# Patient Record
Sex: Female | Born: 1965 | Race: White | Hispanic: No | Marital: Single | Smoking: Former smoker
Health system: Southern US, Community
[De-identification: ages and names within clinical notes are randomized; demographics above are authoritative.]

## PROBLEM LIST (undated history)

## (undated) DIAGNOSIS — N809 Endometriosis, unspecified: Secondary | ICD-10-CM

## (undated) DIAGNOSIS — I10 Essential (primary) hypertension: Secondary | ICD-10-CM

## (undated) DIAGNOSIS — N3010 Interstitial cystitis (chronic) without hematuria: Secondary | ICD-10-CM

## (undated) DIAGNOSIS — Z9889 Other specified postprocedural states: Secondary | ICD-10-CM

## (undated) DIAGNOSIS — R112 Nausea with vomiting, unspecified: Secondary | ICD-10-CM

## (undated) HISTORY — DX: Endometriosis, unspecified: N80.9

## (undated) HISTORY — DX: Interstitial cystitis (chronic) without hematuria: N30.10

---

## 1996-09-28 HISTORY — PX: LAPAROSCOPY: SHX197

## 2005-12-02 ENCOUNTER — Ambulatory Visit: Payer: Self-pay | Admitting: Unknown Physician Specialty

## 2006-07-21 ENCOUNTER — Ambulatory Visit: Payer: Self-pay | Admitting: Psychiatry

## 2007-11-09 ENCOUNTER — Ambulatory Visit: Payer: Self-pay | Admitting: Unknown Physician Specialty

## 2009-02-20 ENCOUNTER — Ambulatory Visit: Payer: Self-pay | Admitting: Unknown Physician Specialty

## 2010-03-26 ENCOUNTER — Other Ambulatory Visit: Payer: Self-pay | Admitting: Psychiatry

## 2010-04-02 ENCOUNTER — Ambulatory Visit: Payer: Self-pay

## 2010-04-28 ENCOUNTER — Encounter: Payer: Self-pay | Admitting: Internal Medicine

## 2010-05-18 ENCOUNTER — Emergency Department: Payer: Self-pay | Admitting: Emergency Medicine

## 2010-05-20 ENCOUNTER — Ambulatory Visit: Payer: Self-pay | Admitting: Internal Medicine

## 2010-05-27 ENCOUNTER — Ambulatory Visit: Payer: Self-pay | Admitting: Pain Medicine

## 2010-05-29 ENCOUNTER — Encounter: Payer: Self-pay | Admitting: Internal Medicine

## 2010-06-12 ENCOUNTER — Ambulatory Visit: Payer: Self-pay | Admitting: Pain Medicine

## 2010-06-26 ENCOUNTER — Ambulatory Visit: Payer: Self-pay | Admitting: Pain Medicine

## 2010-08-19 ENCOUNTER — Ambulatory Visit (HOSPITAL_COMMUNITY)
Admission: RE | Admit: 2010-08-19 | Discharge: 2010-08-19 | Payer: Self-pay | Source: Home / Self Care | Admitting: Psychiatry

## 2010-09-17 ENCOUNTER — Ambulatory Visit: Payer: Self-pay | Admitting: Internal Medicine

## 2012-04-19 ENCOUNTER — Encounter: Payer: Self-pay | Admitting: Orthopedic Surgery

## 2012-04-28 ENCOUNTER — Encounter: Payer: Self-pay | Admitting: Orthopedic Surgery

## 2012-05-29 ENCOUNTER — Encounter: Payer: Self-pay | Admitting: Orthopedic Surgery

## 2012-06-28 ENCOUNTER — Encounter: Payer: Self-pay | Admitting: Orthopedic Surgery

## 2012-07-13 ENCOUNTER — Ambulatory Visit: Payer: Self-pay | Admitting: Internal Medicine

## 2012-09-28 HISTORY — PX: ORIF TIBIA & FIBULA FRACTURES: SHX2131

## 2014-09-28 HISTORY — PX: BACK SURGERY: SHX140

## 2015-08-01 ENCOUNTER — Other Ambulatory Visit: Payer: Self-pay | Admitting: Internal Medicine

## 2015-08-01 DIAGNOSIS — M5416 Radiculopathy, lumbar region: Secondary | ICD-10-CM

## 2015-08-07 ENCOUNTER — Ambulatory Visit
Admission: RE | Admit: 2015-08-07 | Discharge: 2015-08-07 | Disposition: A | Discharge status: Home / Self Care | Payer: PRIVATE HEALTH INSURANCE | Source: Ambulatory Visit | Attending: Internal Medicine | Admitting: Internal Medicine

## 2015-08-07 DIAGNOSIS — M47897 Other spondylosis, lumbosacral region: Secondary | ICD-10-CM | POA: Diagnosis not present

## 2015-08-07 DIAGNOSIS — M5126 Other intervertebral disc displacement, lumbar region: Secondary | ICD-10-CM | POA: Diagnosis not present

## 2015-08-07 DIAGNOSIS — G544 Lumbosacral root disorders, not elsewhere classified: Secondary | ICD-10-CM | POA: Insufficient documentation

## 2015-08-07 DIAGNOSIS — M5416 Radiculopathy, lumbar region: Secondary | ICD-10-CM | POA: Diagnosis present

## 2015-08-07 DIAGNOSIS — M4806 Spinal stenosis, lumbar region: Secondary | ICD-10-CM | POA: Diagnosis not present

## 2015-08-07 DIAGNOSIS — M21372 Foot drop, left foot: Secondary | ICD-10-CM | POA: Diagnosis present

## 2015-08-13 ENCOUNTER — Other Ambulatory Visit: Payer: Self-pay | Admitting: Internal Medicine

## 2015-08-13 DIAGNOSIS — Z1231 Encounter for screening mammogram for malignant neoplasm of breast: Secondary | ICD-10-CM

## 2015-08-14 ENCOUNTER — Ambulatory Visit
Admission: RE | Admit: 2015-08-14 | Discharge: 2015-08-14 | Disposition: A | Discharge status: Home / Self Care | Payer: PRIVATE HEALTH INSURANCE | Source: Ambulatory Visit | Attending: Internal Medicine | Admitting: Internal Medicine

## 2015-08-14 DIAGNOSIS — Z1231 Encounter for screening mammogram for malignant neoplasm of breast: Secondary | ICD-10-CM

## 2015-08-15 ENCOUNTER — Ambulatory Visit: Payer: PRIVATE HEALTH INSURANCE

## 2015-10-01 ENCOUNTER — Ambulatory Visit: Payer: PRIVATE HEALTH INSURANCE

## 2015-10-03 ENCOUNTER — Ambulatory Visit: Payer: PRIVATE HEALTH INSURANCE | Attending: Neurosurgery

## 2015-10-03 DIAGNOSIS — M21372 Foot drop, left foot: Secondary | ICD-10-CM | POA: Diagnosis present

## 2015-10-03 DIAGNOSIS — M5442 Lumbago with sciatica, left side: Secondary | ICD-10-CM | POA: Diagnosis present

## 2015-10-03 NOTE — Patient Instructions (Signed)
Diaphragmatic breathing (initiating TA contraction)  Diaphragmatic breathing (initiating TA contraction) with bridging Pt education on neutral spine and pelvis for sitting and standing

## 2015-10-03 NOTE — Therapy (Signed)
[place] [facility] [address] [place], [place], [address] Phone: [phone]   Fax:  [phone]  Physical Therapy Treatment  Patient Details  Name: Susan Maxwell MRN: [id number] Date of Birth: 09-04-1966 Referring Provider: Peggye LeyJeffery Jenkins  Encounter Date: 10/03/2015      PT End of Session - 10/03/15 1241    Visit Number 1   Number of Visits 13   Date for PT Re-Evaluation 11/14/15   PT Start Time 0902   PT Stop Time 0955   PT Time Calculation (min) 53 min   Activity Tolerance Patient tolerated treatment well   Behavior During Therapy [facility] for tasks assessed/performed      No past medical history on file.  No past surgical history on file.  There were no vitals filed for this visit.  Visit Diagnosis:  Midline low back pain with left-sided sciatica - Plan: PT plan of care cert/re-cert  Left foot drop - Plan: PT plan of care cert/re-cert      Subjective Assessment - 10/03/15 0908    Subjective pt reports in mid october she had a "different kind of back pain" she felt from back, radiating into the L buttocks/groin. she reports a week later she noticed she was walking and "hearing her foot slap". She followed up with MD and had an MRI which showed a disc protruding onto the L L4 nerve root. she underwent discectomy 08/25/16. She reports the surgery went well and reports she feels her leg  and ankle feels 30% better.    Patient Stated Goals pt would like to have more strength in the L leg   Currently in Pain? No/denies  she has intermittant pain 5/10 in the lower back            [facility] PT Assessment - 10/03/15 0001    Assessment   Medical Diagnosis s/p discectomy   Referring Provider Peggye LeyJeffery Jenkins   Onset Date/Surgical Date 08/26/15   Prior Therapy no   Precautions   Precautions Back  no bending lifting or twisting   Balance Screen   Has the patient fallen in the past 6 months No   Has the patient had a  decrease in activity level because of a fear of falling?  No   Is the patient reluctant to leave their home because of a fear of falling?  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   Vocation Full time employment   Vocation Requirements bending, lifting, moving patients   Leisure walking, would like to be able to run   Cognition   Overall Cognitive Status Within Functional Limits for tasks assessed         POSTURE/OBSERVATION: Reduced lordosis in sitting   PROM/AROM: did not measure lumbar ROM due to restrictions  STRENGTH:  Graded on a 0-5 scale Muscle Group Left Right                          Hip Flex 3+ 4  Hip Abd 4- 4  Hip Add 4- 4   Hip Ext 3+ 3+  Hip IR/ER    Knee Flex 4- 4+  Knee Ext 4 4  Ankle DF 4- 5  Ankle PF 4 4+   Dermatomes impaired L L4 Myotomes impaired L L4, also noted weakness noted above     SPECIAL TESTS: SLR- reduced hamstring flexibility on the L with SLR to 50deg, R side 70 deg  FUNCTIONAL MOBILITY: Independent with all bed mobility and transfers without AD. Pt educated on log roll  BALANCE: Impaired SLS om LLe  GAIT: L foot slap, increases with fatigue  OUTCOME MEASURES: TEST Outcome Interpretation  5 times sit<>stand sec >60 yo, >15 sec indicates increased risk for falls  10 meter walk test                 m/s <1.0 m/s indicates increased risk for falls; limited community ambulator  Timed up and Go                 sec <14 sec indicates increased risk for falls  6 minute walk test     1475           Feet  1000 feet is community Financial controller  <36/56 (100% risk for falls), 37-45 (80% risk for falls); 46-51 (>50% risk for falls); 52-55 (lower risk <25% of falls)                          PT Education - 10/03/15 1241    Education provided Yes   Education Details HEP, POC   Person(s) Educated Patient   Methods Explanation   Comprehension Verbalized understanding;Returned demonstration             PT Long Term Goals - 10/03/15 1250    PT LONG TERM GOAL #1   Title pt will be able to confidently run x 52m to return to PLOF.    Time 6   Period Weeks   Status New   PT LONG TERM GOAL #2   Title pt will demonstrate proper lifting mechanics of 25lbs from floor to table x 5 with no lower back pain.    Time 6   Period Weeks   Status New   PT LONG TERM GOAL #3   Title pt will improve walk distance to 1860ft for age norm without foot slap.    Time 6   Period Weeks   Status New   PT LONG TERM GOAL #4   Title pt will improve core and/or rectus abdominis strength to at least 3/5 for improved core stability    Baseline 2/5   Time 6   Period Weeks   Status New               Plan - 10/03/15 1242    Clinical Impression Statement pt presents with improving LE symptoms s/p discectomy performed 08/26/15. she currently is on lumbar restrictions of no bending, lifting or twisting. she demonstrates bilateral LE weakness L>R with + foot slap on the L. she does still have some reduced sensation along L4 dermatome, but is improving. she also demonstrates reduced core strength which could lead to further lower back pain and re-injury, especially given her occupation. pt would benefit from skilled PT services to address the above stated impairments to return to PLOF, reduce pain, prevent new injury and maximize function.    Pt will benefit from  skilled therapeutic intervention in order to improve on the following deficits Abnormal gait;Decreased activity  tolerance;Decreased balance;Hypomobility;Decreased strength;Increased muscle spasms;Pain;Postural dysfunction   Rehab Potential Good   PT Frequency 2x / week   PT Duration 6 weeks   PT Treatment/Interventions ADLs/Self Care Home Management;Aquatic Therapy;Electrical Stimulation;Cryotherapy;Moist Heat;Patient/family education;Neuromuscular re-education;Balance training;Therapeutic exercise;Therapeutic activities;Functional mobility training;Stair training;Gait training;Manual techniques;Dry needling   PT Next Visit Plan progress HEP        Problem List There are no active problems to display for this patient.  Susan Maxwell. Susan Maxwell, PT, DPT [phone]  Susan Maxwell 10/03/2015, 12:56 PM   [facility] MAIN [facility] SERVICES [address] [place], [place], [address] Phone: [phone]   Fax:  [phone]  Name: Susan Maxwell MRN: [id number] Date of Birth: 12-07-1965

## 2015-10-09 ENCOUNTER — Ambulatory Visit: Payer: PRIVATE HEALTH INSURANCE

## 2015-10-09 DIAGNOSIS — M21372 Foot drop, left foot: Secondary | ICD-10-CM

## 2015-10-09 DIAGNOSIS — M5442 Lumbago with sciatica, left side: Secondary | ICD-10-CM | POA: Diagnosis not present

## 2015-10-10 NOTE — Therapy (Signed)
[facility] MAIN [facility] SERVICES [address] [place], [place], [address] Phone: [phone]   Fax:  [phone]  Physical Therapy Treatment  Patient Details  Name: Susan Maxwell MRN: [id number] Date of Birth: [birth year] Referring Provider: Peggye Ley  Encounter Date: 10/09/2015      PT End of Session - 10/10/15 0836    Visit Number 2   Number of Visits 13   Date for PT Re-Evaluation 11/14/15   PT Start Time 1700   PT Stop Time 1740   PT Time Calculation (min) 40 min   Activity Tolerance Patient tolerated treatment well   Behavior During Therapy [facility] for tasks assessed/performed      No past medical history on file.  No past surgical history on file.  There were no vitals filed for this visit.  Visit Diagnosis:  Left foot drop      Subjective Assessment - 10/10/15 0835    Subjective pt reports she noticed her L foot is not slapping as bad during gait. pt reports sitting in a chair and twisting with her foot planted injuring her knee. she would like PT to screen it.    Patient Stated Goals pt would like to have more strength in the L leg       R knee screen provided per pt request no charge: (-) valgus/varus stress test (-) anterior drawer/lachmans test  (+) pain with palpation to medial and lateral joint like (+) McMurry';s test for pain , no popping (+) Thessalys test for pain  NMR: NMES provided to L anterior tibialis in sitting 10s on / 20s off time x 15 min total at 10mA. Pt cued to dorsiflex during on time  Therex: Diaphragmatic breathing (initiating TA contraction) with bridging 2x10 Diaphragmatic breathing (initiating TA contraction) with alt LE march x 10 Diaphragmatic breathing (initiating TA contraction) with sidelying hip abduction x 10  Diaphragmatic breathing (initiating TA contraction) with sidelying hip adduction x 10 Pt requires min verbal and tactile cues for proper exercise performance  Especially  focusing on exhale during lifting                          PT Education - 10/10/15 0836    Education provided Yes   Education Details knee screen findings. purpose of NMES, HEP review   Person(s) Educated Patient   Methods Explanation   Comprehension Verbalized understanding             PT Long Term Goals - 10/03/15 1250    PT LONG TERM GOAL #1   Title pt will be able to confidently run x 95m to return to PLOF.    Time 6   Period Weeks   Status New   PT LONG TERM GOAL #2   Title pt will demonstrate proper lifting mechanics of 25lbs from floor to table x 5 with no lower back pain.    Time 6   Period Weeks   Status New   PT LONG TERM GOAL #3   Title pt will improve walk distance to 1869ft for age norm without foot slap.    Time 6   Period Weeks   Status New   PT LONG TERM GOAL #4   Title pt will improve core and/or rectus abdominis strength to at least 3/5 for improved core stability    Baseline 2/5   Time 6   Period Weeks   Status New  Plan - 10/10/15 0837    Clinical Impression Statement pt tolerated NMES well to the L anterior tibialis. R knee screen provided by PT at no charge. no ligamentus instability was found. she does have a + Thessaly's test. PT reinstructed HEP with focus on proper diaphragamatic breathing.    Pt will benefit from skilled therapeutic intervention in order to improve on the following deficits Abnormal gait;Decreased activity tolerance;Decreased balance;Hypomobility;Decreased strength;Increased muscle spasms;Pain;Postural dysfunction   Rehab Potential Good   PT Frequency 2x / week   PT Duration 6 weeks   PT Treatment/Interventions ADLs/Self Care Home Management;Aquatic Therapy;Electrical Stimulation;Cryotherapy;Moist Heat;Patient/family education;Neuromuscular re-education;Balance training;Therapeutic exercise;Therapeutic activities;Functional mobility training;Stair training;Gait training;Manual  techniques;Dry needling   PT Next Visit Plan progress HEP        Problem List There are no active problems to display for this patient.  Carlyon ShadowAshley C. Kaheem Halleck, PT, DPT [phone]   Alixandrea Milleson 10/10/2015, 8:39 AM  [place] [facility] [address] [place], [place], [address] Phone: [phone]   Fax:  [phone]  Name: Susan Maxwell MRN: [id number] Date of Birth: 11/03/1965

## 2015-10-14 ENCOUNTER — Ambulatory Visit: Payer: PRIVATE HEALTH INSURANCE

## 2015-10-14 DIAGNOSIS — M5442 Lumbago with sciatica, left side: Secondary | ICD-10-CM | POA: Diagnosis not present

## 2015-10-14 DIAGNOSIS — M21372 Foot drop, left foot: Secondary | ICD-10-CM

## 2015-10-15 NOTE — Therapy (Signed)
[place] [facility] MAIN [facility] SERVICES [address] [place], [place], [address] Phone: [phone]   Fax:  [phone]  Physical Therapy Treatment  Patient Details  Name: Susan Maxwell MRN: [id number] Date of Birth: 1966/06/16 Referring Provider: Peggye Ley  Encounter Date: 10/14/2015      PT End of Session - 10/14/15 1843    Visit Number 3   Number of Visits 13   Date for PT Re-Evaluation 11/14/15   PT Start Time 1651   PT Stop Time 1730   PT Time Calculation (min) 39 min   Activity Tolerance Patient tolerated treatment well   Behavior During Therapy [facility] for tasks assessed/performed      No past medical history on file.  No past surgical history on file.  There were no vitals filed for this visit.  Visit Diagnosis:  Left foot drop      Subjective Assessment - 10/14/15 1657    Subjective Patient  reports she continues to have improved symptoms, she is noticing less and less foot slapping. Her R knee is feeling better   Patient Stated Goals pt would like to have more strength in the L leg   Currently in Pain? --  Patient reports her R knee is improving from last week.          PF/DF x 15 with eccentric control focus on LLE (brought down on 1 leg) x3 sets   Side step-ups with emphasis on eccentric control on LLE 3 sets x 15 repetitions on 4" step. Progressed to holding a 5# DB for 3rd set.   Squats with bilateral HHA with cuing for increased depth. 1st set x 8, 2nd set x 10, 3rd set x 12 repetitions.   Dynadisc - single leg stance, quite challenging at this time on LLE as she required holding on to // bars after > 5 seconds on multiple attempts, regressed to blue foam pad.   Balance on single leg stance on blue foam pad on LLE 3 bouts of 20 seconds with rest breaks in between each. Noted dynamic stabilization at her ankle with each bout in all directions.   ** Patient reports fatigue, particularly in the region of the Tibialis  anterior after session.                          PT Education - 10/14/15 1842    Education provided Yes   Education Details Educated patient on causes of foot drop, provided strengthening tx for TA and LE musculature.    Person(s) Educated Patient   Methods Explanation;Demonstration;Verbal cues;Handout   Comprehension Returned demonstration;Verbalized understanding             PT Long Term Goals - 10/03/15 1250    PT LONG TERM GOAL #1   Title pt will be able to confidently run x 21m to return to PLOF.    Time 6   Period Weeks   Status New   PT LONG TERM GOAL #2   Title pt will demonstrate proper lifting mechanics of 25lbs from floor to table x 5 with no lower back pain.    Time 6   Period Weeks   Status New   PT LONG TERM GOAL #3   Title pt will improve walk distance to 187ft for age norm without foot slap.    Time 6   Period Weeks   Status New   PT LONG TERM GOAL #4  Title pt will improve core and/or rectus abdominis strength to at least 3/5 for improved core stability    Baseline 2/5   Time 6   Period Weeks   Status New               Plan - 10/14/15 1843    Clinical Impression Statement Patient reports decreased R knee pain from previous session and reports appropriate levels of fatigue with ther-ex provided in this session focusing on eccentric control of the TA. Subjectively it appears her foot drop was decreased at the conclusion of session, after intense focus placed on single leg control on LLE. Patient would benefit from additional single leg balance and strengthening on LLE to improve NM control and TA strength.    Pt will benefit from skilled therapeutic intervention in order to improve on the following deficits Abnormal gait;Decreased activity tolerance;Decreased balance;Hypomobility;Decreased strength;Increased muscle spasms;Pain;Postural dysfunction   Rehab Potential Good   PT Frequency 2x / week   PT Duration 6 weeks   PT  Treatment/Interventions ADLs/Self Care Home Management;Aquatic Therapy;Electrical Stimulation;Cryotherapy;Moist Heat;Patient/family education;Neuromuscular re-education;Balance training;Therapeutic exercise;Therapeutic activities;Functional mobility training;Stair training;Gait training;Manual techniques;Dry needling   PT Next Visit Plan progress HEP   Consulted and Agree with Plan of Care Patient        Problem List There are no active problems to display for this patient.   Kerin Ransom, PT, DPT    10/15/2015, 9:04 AM  [place] [facility] MAIN [facility] SERVICES [address] [place], [place], [address] Phone: [phone]   Fax:  [phone]  Name: JUSTA HATCHELL MRN: [id number] Date of Birth: 18-Feb-1966

## 2015-10-16 ENCOUNTER — Ambulatory Visit: Payer: PRIVATE HEALTH INSURANCE

## 2016-04-22 ENCOUNTER — Ambulatory Visit
Admission: RE | Admit: 2016-04-22 | Discharge: 2016-04-22 | Disposition: A | Discharge status: Home / Self Care | Payer: PRIVATE HEALTH INSURANCE | Source: Ambulatory Visit | Attending: Physical Medicine and Rehabilitation | Admitting: Physical Medicine and Rehabilitation

## 2016-04-22 ENCOUNTER — Other Ambulatory Visit: Payer: Self-pay | Admitting: Physical Medicine and Rehabilitation

## 2016-04-22 DIAGNOSIS — M47816 Spondylosis without myelopathy or radiculopathy, lumbar region: Secondary | ICD-10-CM | POA: Insufficient documentation

## 2016-04-22 DIAGNOSIS — M5416 Radiculopathy, lumbar region: Secondary | ICD-10-CM

## 2016-06-09 ENCOUNTER — Other Ambulatory Visit: Payer: Self-pay | Admitting: Internal Medicine

## 2016-06-09 DIAGNOSIS — M5116 Intervertebral disc disorders with radiculopathy, lumbar region: Secondary | ICD-10-CM

## 2016-06-19 ENCOUNTER — Ambulatory Visit: Payer: Managed Care, Other (non HMO)

## 2016-07-29 ENCOUNTER — Other Ambulatory Visit (HOSPITAL_COMMUNITY): Payer: Self-pay | Admitting: Neurosurgery

## 2016-07-29 ENCOUNTER — Other Ambulatory Visit: Payer: Self-pay | Admitting: Neurosurgery

## 2016-07-29 DIAGNOSIS — M5416 Radiculopathy, lumbar region: Secondary | ICD-10-CM

## 2016-08-12 ENCOUNTER — Ambulatory Visit
Admission: RE | Admit: 2016-08-12 | Discharge: 2016-08-12 | Disposition: A | Discharge status: Home / Self Care | Payer: Managed Care, Other (non HMO) | Source: Ambulatory Visit | Attending: Neurosurgery | Admitting: Neurosurgery

## 2016-08-12 DIAGNOSIS — M5416 Radiculopathy, lumbar region: Secondary | ICD-10-CM | POA: Insufficient documentation

## 2016-08-12 DIAGNOSIS — M48061 Spinal stenosis, lumbar region without neurogenic claudication: Secondary | ICD-10-CM | POA: Diagnosis not present

## 2016-08-12 LAB — POCT I-STAT CREATININE: Creatinine, Ser: 0.9 mg/dL (ref 0.44–1.00)

## 2016-08-12 MED ORDER — GADOBENATE DIMEGLUMINE 529 MG/ML IV SOLN
20.0000 mL | Freq: Once | INTRAVENOUS | Status: AC | PRN
  Administered 2016-08-12: 20 mL via INTRAVENOUS

## 2016-11-16 IMAGING — CR DG LUMBAR SPINE COMPLETE 4+V
5 series · 5 of 5 positions shown · non-contrast
Comparison: None.

CLINICAL DATA: Right leg numbness and tingling.

EXAM:
LUMBAR SPINE - COMPLETE 4+ VIEW

[l-spine ap]
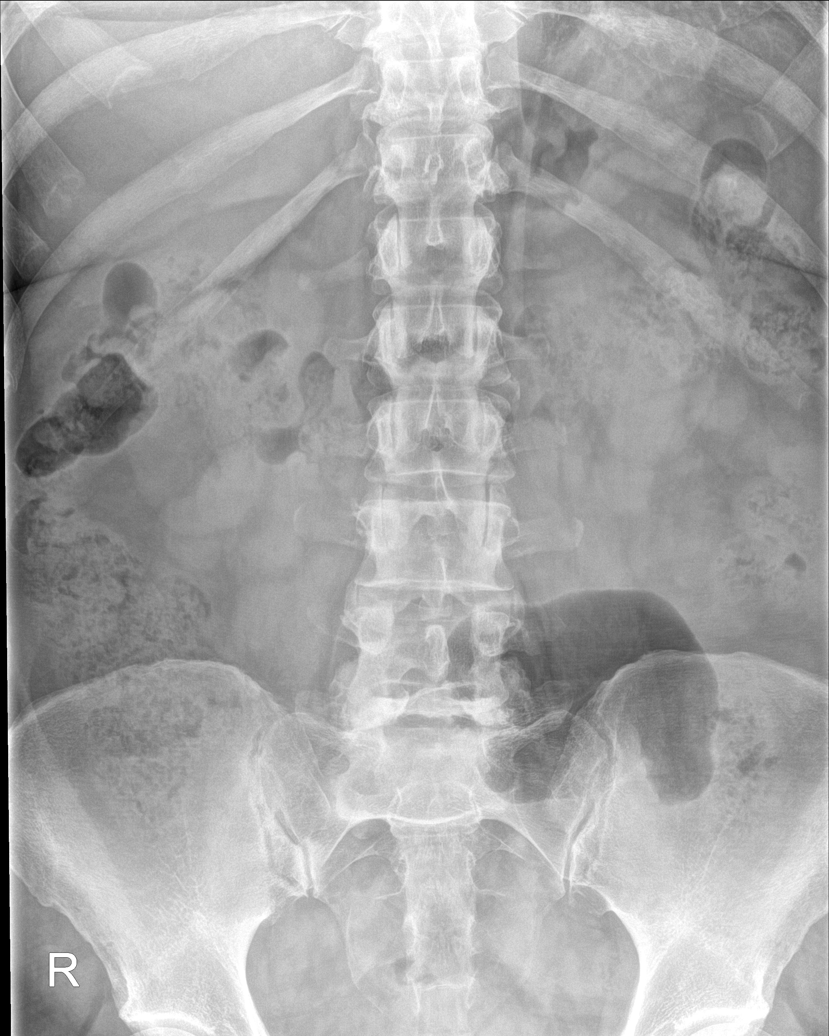

[l-spine obl (1 of 2)]
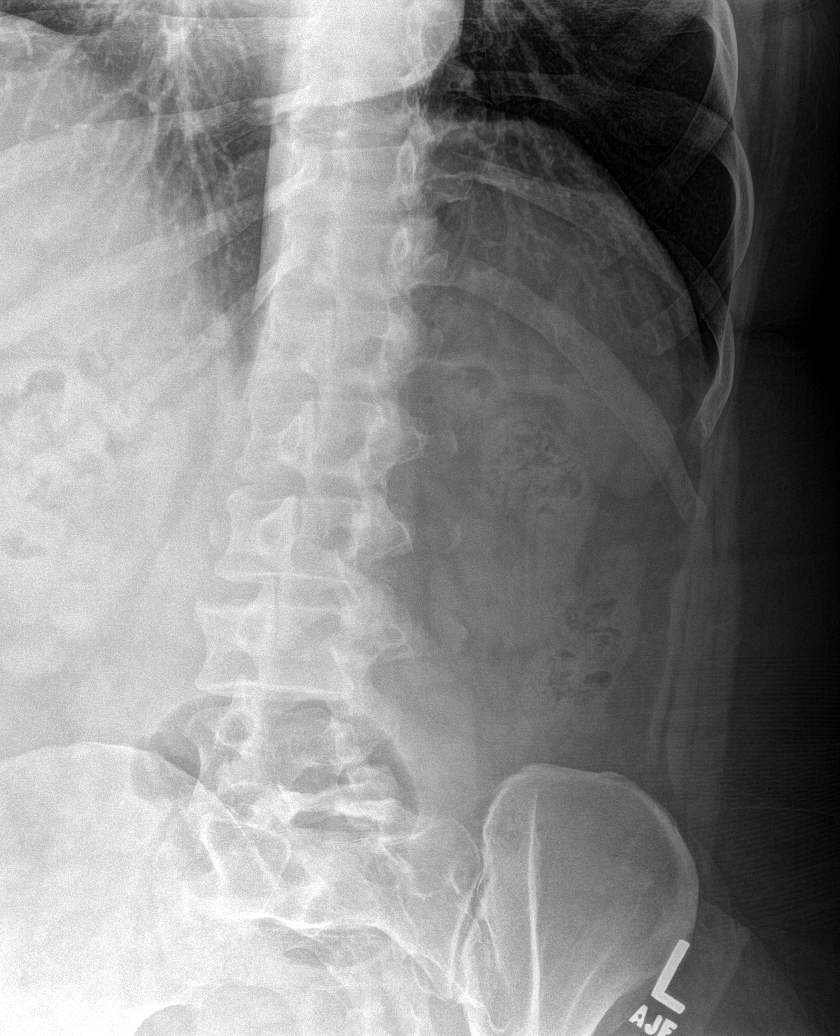

[l-spine obl (2 of 2)]
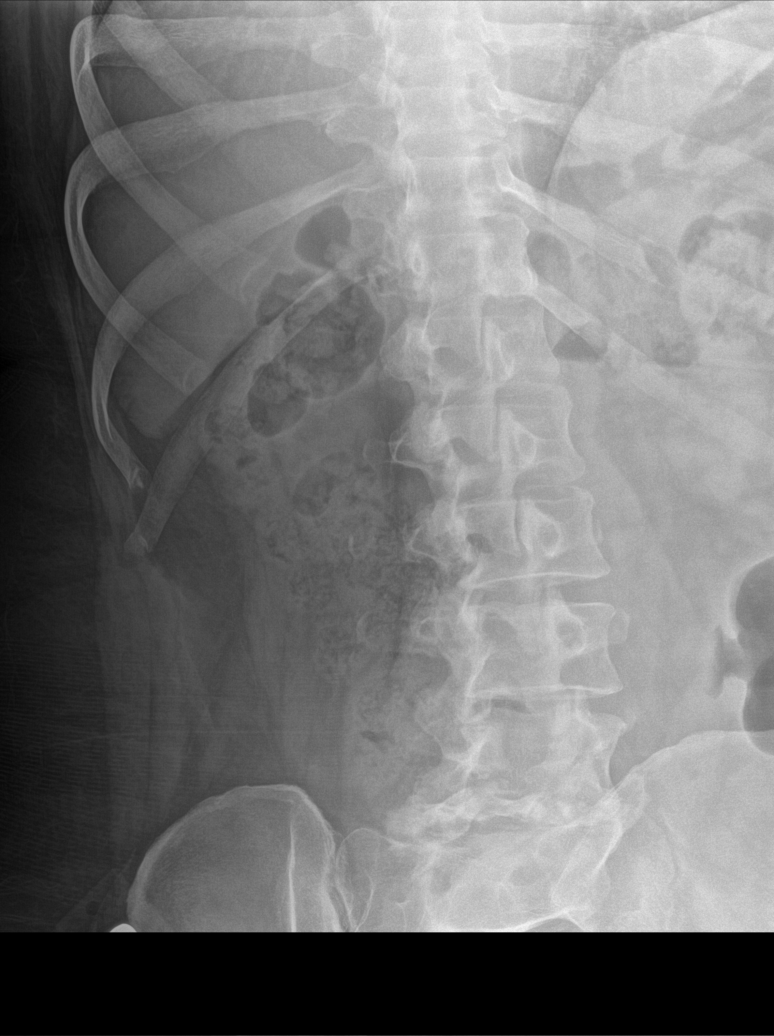

[l-spine lat]
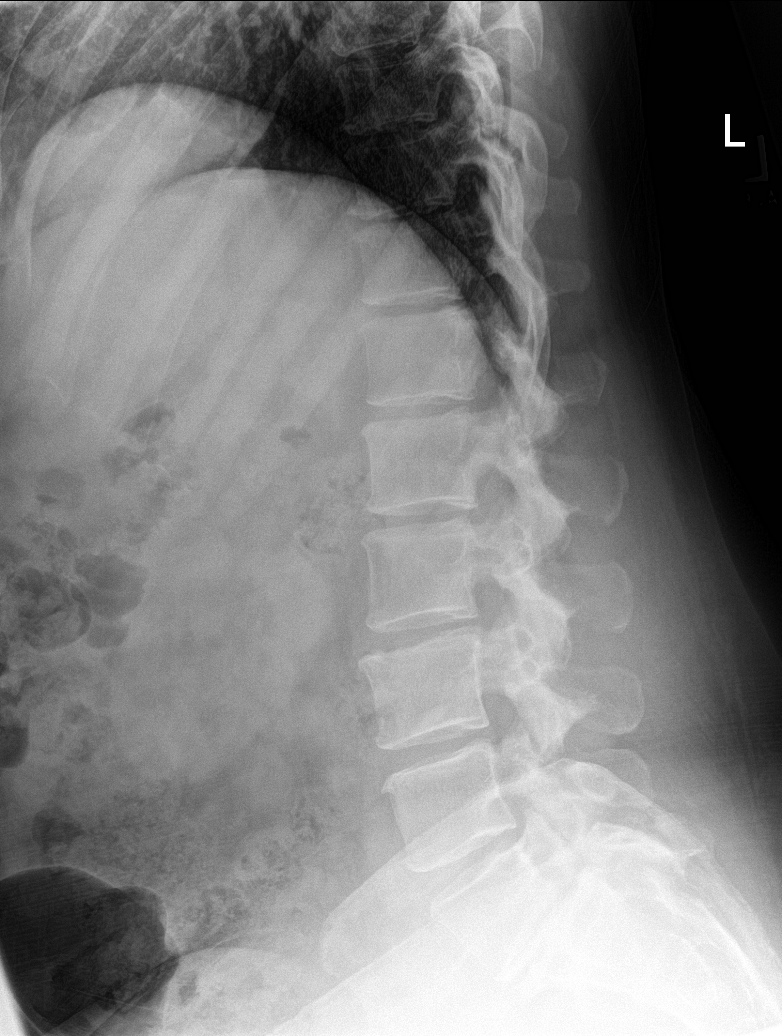

[l-spine spot]
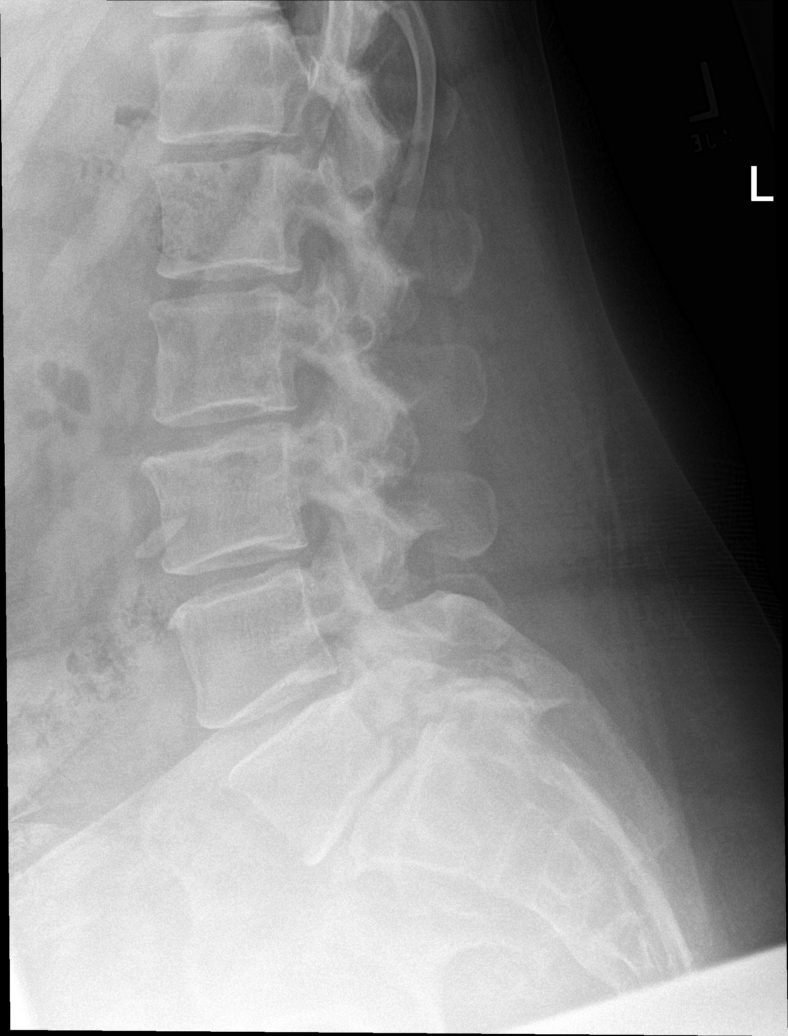

[5 of 5 positions shown; findings below may reference images not displayed]

FINDINGS: There are 5 nonrib bearing lumbar-type vertebral bodies.

The vertebral body heights are maintained.

There is grade 1 anterolisthesis of L5 on S1 secondary to bilateral
L5 pars interarticularis defects. There is no spondylolysis.

There is no acute fracture.

Mild degenerative disc disease at L2-3 and L4-5. Severe degenerative
disc disease with disc height loss at L5-S1.

The SI joints are unremarkable.
IMPRESSION: Lumbar spine spondylosis as described above.

## 2017-06-24 ENCOUNTER — Other Ambulatory Visit: Payer: Self-pay | Admitting: Internal Medicine

## 2017-06-24 DIAGNOSIS — Z1231 Encounter for screening mammogram for malignant neoplasm of breast: Secondary | ICD-10-CM

## 2018-05-03 ENCOUNTER — Other Ambulatory Visit: Payer: Self-pay | Admitting: Internal Medicine

## 2018-05-03 ENCOUNTER — Ambulatory Visit
Admission: RE | Admit: 2018-05-03 | Discharge: 2018-05-03 | Disposition: A | Discharge status: Home / Self Care | Payer: Managed Care, Other (non HMO) | Source: Ambulatory Visit | Attending: Internal Medicine | Admitting: Internal Medicine

## 2018-05-03 DIAGNOSIS — Z1231 Encounter for screening mammogram for malignant neoplasm of breast: Secondary | ICD-10-CM

## 2018-05-04 ENCOUNTER — Ambulatory Visit
Admission: RE | Admit: 2018-05-04 | Discharge: 2018-05-04 | Disposition: A | Discharge status: Home / Self Care | Payer: Managed Care, Other (non HMO) | Source: Ambulatory Visit | Attending: Internal Medicine | Admitting: Internal Medicine

## 2018-05-04 ENCOUNTER — Other Ambulatory Visit: Payer: Self-pay | Admitting: Internal Medicine

## 2018-05-04 DIAGNOSIS — Z1231 Encounter for screening mammogram for malignant neoplasm of breast: Secondary | ICD-10-CM

## 2018-05-10 ENCOUNTER — Other Ambulatory Visit: Payer: Self-pay

## 2018-05-10 DIAGNOSIS — Z1211 Encounter for screening for malignant neoplasm of colon: Secondary | ICD-10-CM

## 2018-05-10 MED ORDER — NA SULFATE-K SULFATE-MG SULF 17.5-3.13-1.6 GM/177ML PO SOLN: 1.0000 | Freq: Once | ORAL | 0 refills | Status: AC

## 2018-05-26 ENCOUNTER — Encounter: Payer: Self-pay | Admitting: *Deleted

## 2018-05-26 ENCOUNTER — Other Ambulatory Visit: Payer: Self-pay

## 2018-06-01 NOTE — Discharge Instructions (Signed)
General Anesthesia, Adult, Care After °These instructions provide you with information about caring for yourself after your procedure. Your health care provider may also give you more specific instructions. Your treatment has been planned according to current medical practices, but problems sometimes occur. Call your health care provider if you have any problems or questions after your procedure. °What can I expect after the procedure? °After the procedure, it is common to have: °· Vomiting. °· A sore throat. °· Mental slowness. ° °It is common to feel: °· Nauseous. °· Cold or shivery. °· Sleepy. °· Tired. °· Sore or achy, even in parts of your body where you did not have surgery. ° °Follow these instructions at home: °For at least 24 hours after the procedure: °· Do not: °? Participate in activities where you could fall or become injured. °? Drive. °? Use heavy machinery. °? Drink alcohol. °? Take sleeping pills or medicines that cause drowsiness. °? Make important decisions or sign legal documents. °? Take care of children on your own. °· Rest. °Eating and drinking °· If you vomit, drink water, juice, or soup when you can drink without vomiting. °· Drink enough fluid to keep your urine clear or pale yellow. °· Make sure you have little or no nausea before eating solid foods. °· Follow the diet recommended by your health care provider. °General instructions °· Have a responsible adult stay with you until you are awake and alert. °· Return to your normal activities as told by your health care provider. Ask your health care provider what activities are safe for you. °· Take over-the-counter and prescription medicines only as told by your health care provider. °· If you smoke, do not smoke without supervision. °· Keep all follow-up visits as told by your health care provider. This is important. °Contact a health care provider if: °· You continue to have nausea or vomiting at home, and medicines are not helpful. °· You  cannot drink fluids or start eating again. °· You cannot urinate after 8-12 hours. °· You develop a skin rash. °· You have fever. °· You have increasing redness at the site of your procedure. °Get help right away if: °· You have difficulty breathing. °· You have chest pain. °· You have unexpected bleeding. °· You feel that you are having a life-threatening or urgent problem. °This information is not intended to replace advice given to you by your health care provider. Make sure you discuss any questions you have with your health care provider. °Document Released: 12/21/2000 Document Revised: 02/17/2016 Document Reviewed: 08/29/2015 °Elsevier Interactive Patient Education © 2018 [employer] Inc[employer] ° °

## 2018-06-02 ENCOUNTER — Ambulatory Visit: Payer: Managed Care, Other (non HMO) | Admitting: Anesthesiology

## 2018-06-02 ENCOUNTER — Encounter
Admission: RE | Disposition: A | Discharge status: Home / Self Care | Payer: Self-pay | Source: Ambulatory Visit | Attending: Gastroenterology

## 2018-06-02 ENCOUNTER — Ambulatory Visit
Admission: RE | Admit: 2018-06-02 | Discharge: 2018-06-02 | Disposition: A | Discharge status: Home / Self Care | Payer: Managed Care, Other (non HMO) | Source: Ambulatory Visit | Attending: Gastroenterology | Admitting: Gastroenterology

## 2018-06-02 DIAGNOSIS — K640 First degree hemorrhoids: Secondary | ICD-10-CM | POA: Insufficient documentation

## 2018-06-02 DIAGNOSIS — Z1211 Encounter for screening for malignant neoplasm of colon: Secondary | ICD-10-CM | POA: Diagnosis present

## 2018-06-02 DIAGNOSIS — Z7982 Long term (current) use of aspirin: Secondary | ICD-10-CM | POA: Diagnosis not present

## 2018-06-02 DIAGNOSIS — I10 Essential (primary) hypertension: Secondary | ICD-10-CM | POA: Diagnosis not present

## 2018-06-02 DIAGNOSIS — Z87891 Personal history of nicotine dependence: Secondary | ICD-10-CM | POA: Insufficient documentation

## 2018-06-02 DIAGNOSIS — K5730 Diverticulosis of large intestine without perforation or abscess without bleeding: Secondary | ICD-10-CM | POA: Diagnosis not present

## 2018-06-02 DIAGNOSIS — Z79899 Other long term (current) drug therapy: Secondary | ICD-10-CM | POA: Diagnosis not present

## 2018-06-02 HISTORY — DX: Nausea with vomiting, unspecified: R11.2

## 2018-06-02 HISTORY — DX: Other specified postprocedural states: Z98.890

## 2018-06-02 HISTORY — PX: COLONOSCOPY WITH PROPOFOL: SHX5780

## 2018-06-02 HISTORY — DX: Essential (primary) hypertension: I10

## 2018-06-02 SURGERY — COLONOSCOPY WITH PROPOFOL
Anesthesia: General | Wound class: Contaminated

## 2018-06-02 MED ORDER — ACETAMINOPHEN 325 MG PO TABS: 325.0000 mg | ORAL_TABLET | Freq: Once | ORAL | Status: DC

## 2018-06-02 MED ORDER — SODIUM CHLORIDE 0.9 % IV SOLN: INTRAVENOUS | Status: DC

## 2018-06-02 MED ORDER — LIDOCAINE HCL (CARDIAC) PF 100 MG/5ML IV SOSY
PREFILLED_SYRINGE | INTRAVENOUS | Status: DC | PRN
  Administered 2018-06-02: 40 mg via INTRAVENOUS

## 2018-06-02 MED ORDER — ACETAMINOPHEN 160 MG/5ML PO SOLN: 325.0000 mg | Freq: Once | ORAL | Status: DC

## 2018-06-02 MED ORDER — PROPOFOL 10 MG/ML IV BOLUS
INTRAVENOUS | Status: DC | PRN
  Administered 2018-06-02: 100 mg via INTRAVENOUS
  Administered 2018-06-02: 50 mg via INTRAVENOUS
  Administered 2018-06-02: 30 mg via INTRAVENOUS
  Administered 2018-06-02 (×2): 40 mg via INTRAVENOUS
  Administered 2018-06-02 (×2): 30 mg via INTRAVENOUS

## 2018-06-02 MED ORDER — LACTATED RINGERS IV SOLN
INTRAVENOUS | Status: DC
  Administered 2018-06-02: 09:00:00 via INTRAVENOUS

## 2018-06-02 SURGICAL SUPPLY — 5 items
CANISTER SUCT 1200ML W/VALVE (MISCELLANEOUS) ×2 IMPLANT
GOWN CVR UNV OPN BCK APRN NK (MISCELLANEOUS) ×2 IMPLANT
GOWN ISOL THUMB LOOP REG UNIV (MISCELLANEOUS) ×2
KIT ENDO PROCEDURE OLY (KITS) ×2 IMPLANT
WATER STERILE IRR 250ML POUR (IV SOLUTION) ×2 IMPLANT

## 2018-06-02 NOTE — H&P (Signed)
Susan Minium, MD [facility] [address], [address] [place], [place] [address] Phone: [phone] Fax : [phone]  Primary Care Physician:  Susan Penton, MD Primary Gastroenterologist:  Dr. Servando Snare  Pre-Procedure History & Physical: HPI:  Susan Maxwell is a 52 y.o. female is here for a screening colonoscopy.   Past Medical History:  Diagnosis Date  . Hypertension   . PONV (postoperative nausea and vomiting)     Past Surgical History:  Procedure Laterality Date  . BACK SURGERY  2016   L4-5  . LAPAROSCOPY  1998   endometrial procedure  . ORIF TIBIA & FIBULA FRACTURES Right 2014    Prior to Admission medications   Medication Sig Start Date End Date Taking? Authorizing Provider  amitriptyline (ELAVIL) 50 MG tablet Take 50 mg by mouth at bedtime as needed for sleep.   Yes [provider]  aspirin 81 MG tablet Take 81 mg by mouth daily.   Yes [provider]  Cholecalciferol (VITAMIN D3) 2000 units TABS Take by mouth daily.   Yes [provider]  cloNIDine (CATAPRES) 0.2 MG tablet Take 0.2 mg by mouth 3 (three) times daily.   Yes [provider]  gabapentin (NEURONTIN) 600 MG tablet Take 600 mg by mouth 3 (three) times daily.   Yes [provider]  telmisartan (MICARDIS) 80 MG tablet Take 80 mg by mouth daily.   Yes [provider]    Allergies as of 05/10/2018  . (Not on File)    Family History  Problem Relation Age of Onset  . Breast cancer Neg Hx     Social History   Socioeconomic History  . Marital status: Single    Spouse name: Not on file  . Number of children: Not on file  . Years of education: Not on file  . Highest education level: Not on file  Occupational History  . Not on file  Social Needs  . Financial resource strain: Not on file  . Food insecurity:    Worry: Not on file    Inability: Not on file  . Transportation needs:    Medical: Not on file    Non-medical: Not on file  Tobacco Use  .  Smoking status: Former Smoker    Types: Cigarettes    Last attempt to quit: 2014    Years since quitting: 5.6  . Smokeless tobacco: Never Used  . Tobacco comment: was social smoker   Substance and Sexual Activity  . Alcohol use: Not on file  . Drug use: Not on file  . Sexual activity: Not on file  Lifestyle  . Physical activity:    Days per week: Not on file    Minutes per session: Not on file  . Stress: Not on file  Relationships  . Social connections:    Talks on phone: Not on file    Gets together: Not on file    Attends religious service: Not on file    Active member of club or organization: Not on file    Attends meetings of clubs or organizations: Not on file    Relationship status: Not on file  . Intimate partner violence:    Fear of current or ex partner: Not on file    Emotionally abused: Not on file    Physically abused: Not on file    Forced sexual activity: Not on file  Other Topics Concern  . Not on file  Social History Narrative  . Not on file  Review of Systems: See HPI, otherwise negative ROS  Physical Exam: BP (!) 123/91   Pulse 76   Temp (!) 97.5 F (36.4 C) (Temporal)   Resp 15   Ht 5\' 10"  (1.778 m)   Wt 93.4 kg   LMP 03/07/2018 (Approximate) Comment: Preg Test Negative  SpO2 100%   BMI 29.56 kg/m  General:   Alert,  pleasant and cooperative in NAD Head:  Normocephalic and atraumatic. Neck:  Supple; no masses or thyromegaly. Lungs:  Clear throughout to auscultation.    Heart:  Regular rate and rhythm. Abdomen:  Soft, nontender and nondistended. Normal bowel sounds, without guarding, and without rebound.   Neurologic:  Alert and  oriented x4;  grossly normal neurologically.  Impression/Plan: Susan Maxwell is now here to undergo a screening colonoscopy.  Risks, benefits, and alternatives regarding colonoscopy have been reviewed with the patient.  Questions have been answered.  All parties agreeable.

## 2018-06-02 NOTE — Op Note (Signed)
[facility]astroenterology Patient Name: Susan Maxwell Procedure Date: 06/02/2018 9:08 AM MRN: [id number] Account #: [id number] Date of Birth: [birth year] Admit Type: Inpatient Age: 52 Room: [facility] OR ROOM 01 Gender: Female Note Status: Finalized Procedure:            Colonoscopy Indications:          Screening for colorectal malignant neoplasm Providers:            Midge Minium MD, MD Referring MD:         Danella Penton, MD (Referring MD) Medicines:            Propofol per Anesthesia Complications:        No immediate complications. Procedure:            Pre-Anesthesia Assessment:                       - Prior to the procedure, a History and Physical was                        performed, and patient medications and allergies were                        reviewed. The patient's tolerance of previous                        anesthesia was also reviewed. The risks and benefits of                        the procedure and the sedation options and risks were                        discussed with the patient. All questions were                        answered, and informed consent was obtained. Prior                        Anticoagulants: The patient has taken no previous                        anticoagulant or antiplatelet agents. ASA Grade                        Assessment: II - A patient with mild systemic disease.                        After reviewing the risks and benefits, the patient was                        deemed in satisfactory condition to undergo the                        procedure.                       After obtaining informed consent, the colonoscope was                        passed under direct vision. Throughout the procedure,  the patient's blood pressure, pulse, and oxygen                        saturations were monitored continuously. The                        Colonoscope was introduced through the anus and      advanced to the the cecum, identified by appendiceal                        orifice and ileocecal valve. The colonoscopy was                        performed without difficulty. The patient tolerated the                        procedure well. The quality of the bowel preparation                        was excellent. Findings:      The perianal and digital rectal examinations were normal.      A few small-mouthed diverticula were found in the sigmoid colon and       descending colon.      Non-bleeding internal hemorrhoids were found during retroflexion. The       hemorrhoids were Grade I (internal hemorrhoids that do not prolapse). Impression:           - Diverticulosis in the sigmoid colon and in the                        descending colon.                       - Non-bleeding internal hemorrhoids.                       - No specimens collected. Recommendation:       - Discharge patient to home.                       - Resume previous diet.                       - Continue present medications.                       - Repeat colonoscopy in 10 years for screening unless                        any change in family history or lower GI problems. Procedure Code(s):    --- Professional ---                       [phone], Colonoscopy, flexible; diagnostic, including                        collection of specimen(s) by brushing or washing, when                        performed (separate procedure) Diagnosis Code(s):    --- Professional ---  Z12.11, Encounter for screening for malignant neoplasm                        of colon CPT copyright 2017 American Medical Association. All rights reserved. The codes documented in this report are preliminary and upon coder review may  be revised to meet current compliance requirements. Midge Minium MD, MD 06/02/2018 9:33:24 AM This report has been signed electronically. Number of Addenda: 0 Note Initiated On: 06/02/2018 9:08 AM Scope  Withdrawal Time: 0 hours 8 minutes 12 seconds  Total Procedure Duration: 0 hours 11 minutes 45 seconds       [facility]

## 2018-06-02 NOTE — Anesthesia Preprocedure Evaluation (Signed)
Anesthesia Evaluation  Patient identified by MRN, date of birth, ID band Patient awake    Reviewed: Allergy & Precautions, H&P , NPO status , Patient's Chart, lab work & pertinent test results  Airway Mallampati: II  TM Distance: >3 FB Neck ROM: full    Dental no notable dental hx.    Pulmonary former smoker,    Pulmonary exam normal breath sounds clear to auscultation       Cardiovascular hypertension, Normal cardiovascular exam Rhythm:regular Rate:Normal     Neuro/Psych    GI/Hepatic   Endo/Other    Renal/GU      Musculoskeletal   Abdominal   Peds  Hematology   Anesthesia Other Findings   Reproductive/Obstetrics                             Anesthesia Physical Anesthesia Plan  ASA: II  Anesthesia Plan: General   Post-op Pain Management:    Induction: Intravenous  PONV Risk Score and Plan: 3 and Propofol infusion and Treatment may vary due to age or medical condition  Airway Management Planned: Natural Airway  Additional Equipment:   Intra-op Plan:   Post-operative Plan:   Informed Consent: I have reviewed the patients History and Physical, chart, labs and discussed the procedure including the risks, benefits and alternatives for the proposed anesthesia with the patient or authorized representative who has indicated his/her understanding and acceptance.     Plan Discussed with: CRNA  Anesthesia Plan Comments:         Anesthesia Quick Evaluation  

## 2018-06-02 NOTE — Anesthesia Procedure Notes (Signed)
Procedure Name: MAC Date/Time: 06/02/2018 9:15 AM Performed by: Janna Arch, CRNA Pre-anesthesia Checklist: Patient identified, Emergency Drugs available, Suction available, Timeout performed and Patient being monitored Patient Re-evaluated:Patient Re-evaluated prior to induction Oxygen Delivery Method: Nasal cannula Placement Confirmation: positive ETCO2

## 2018-06-02 NOTE — Transfer of Care (Signed)
Immediate Anesthesia Transfer of Care Note  Patient: Susan Maxwell  Procedure(s) Performed: COLONOSCOPY WITH PROPOFOL (N/A )  Patient Location: PACU  Anesthesia Type: General  Level of Consciousness: awake, alert  and patient cooperative  Airway and Oxygen Therapy: Patient Spontanous Breathing and Patient connected to supplemental oxygen  Post-op Assessment: Post-op Vital signs reviewed, Patient's Cardiovascular Status Stable, Respiratory Function Stable, Patent Airway and No signs of Nausea or vomiting  Post-op Vital Signs: Reviewed and stable  Complications: No apparent anesthesia complications

## 2018-06-02 NOTE — Anesthesia Postprocedure Evaluation (Signed)
Anesthesia Post Note  Patient: Susan Maxwell  Procedure(s) Performed: COLONOSCOPY WITH PROPOFOL (N/A )  Patient location during evaluation: PACU Anesthesia Type: General Level of consciousness: awake and alert and oriented Pain management: satisfactory to patient Vital Signs Assessment: post-procedure vital signs reviewed and stable Respiratory status: spontaneous breathing, nonlabored ventilation and respiratory function stable Cardiovascular status: blood pressure returned to baseline and stable Postop Assessment: Adequate PO intake and No signs of nausea or vomiting Anesthetic complications: no    Cherly Beach

## 2018-06-03 ENCOUNTER — Encounter: Payer: Self-pay | Admitting: Gastroenterology

## 2019-02-14 DIAGNOSIS — I10 Essential (primary) hypertension: Secondary | ICD-10-CM | POA: Diagnosis not present

## 2019-02-14 DIAGNOSIS — Z0000 Encounter for general adult medical examination without abnormal findings: Secondary | ICD-10-CM | POA: Diagnosis not present

## 2019-02-14 DIAGNOSIS — N3010 Interstitial cystitis (chronic) without hematuria: Secondary | ICD-10-CM | POA: Diagnosis not present

## 2019-02-14 DIAGNOSIS — R7989 Other specified abnormal findings of blood chemistry: Secondary | ICD-10-CM | POA: Diagnosis not present

## 2019-02-23 DIAGNOSIS — N3010 Interstitial cystitis (chronic) without hematuria: Secondary | ICD-10-CM | POA: Diagnosis not present

## 2019-06-13 DIAGNOSIS — R102 Pelvic and perineal pain: Secondary | ICD-10-CM | POA: Diagnosis not present

## 2019-06-13 DIAGNOSIS — N3010 Interstitial cystitis (chronic) without hematuria: Secondary | ICD-10-CM | POA: Diagnosis not present

## 2019-06-28 DIAGNOSIS — N3010 Interstitial cystitis (chronic) without hematuria: Secondary | ICD-10-CM | POA: Diagnosis not present

## 2019-06-28 DIAGNOSIS — Z23 Encounter for immunization: Secondary | ICD-10-CM | POA: Diagnosis not present

## 2019-07-06 ENCOUNTER — Other Ambulatory Visit: Payer: Self-pay | Admitting: Internal Medicine

## 2019-07-06 DIAGNOSIS — Z1231 Encounter for screening mammogram for malignant neoplasm of breast: Secondary | ICD-10-CM

## 2019-07-25 ENCOUNTER — Inpatient Hospital Stay: Admission: RE | Admit: 2019-07-25 | Payer: Managed Care, Other (non HMO) | Source: Ambulatory Visit

## 2019-11-27 ENCOUNTER — Ambulatory Visit: Payer: Self-pay | Admitting: Urology

## 2019-12-04 ENCOUNTER — Ambulatory Visit: Payer: Self-pay | Admitting: Urology

## 2019-12-18 ENCOUNTER — Encounter: Payer: Self-pay | Admitting: Urology

## 2019-12-18 ENCOUNTER — Other Ambulatory Visit: Payer: Self-pay

## 2019-12-18 ENCOUNTER — Ambulatory Visit (INDEPENDENT_AMBULATORY_CARE_PROVIDER_SITE_OTHER): Payer: 59 | Admitting: Urology

## 2019-12-18 VITALS — BP 152/89 | HR 80

## 2019-12-18 DIAGNOSIS — N3020 Other chronic cystitis without hematuria: Secondary | ICD-10-CM

## 2019-12-18 DIAGNOSIS — N3010 Interstitial cystitis (chronic) without hematuria: Secondary | ICD-10-CM | POA: Diagnosis not present

## 2019-12-18 LAB — URINALYSIS, COMPLETE
Bilirubin, UA: NEGATIVE
Glucose, UA: NEGATIVE
Ketones, UA: NEGATIVE
Leukocytes,UA: NEGATIVE
Nitrite, UA: NEGATIVE
Protein,UA: NEGATIVE
RBC, UA: NEGATIVE
Specific Gravity, UA: 1.02 (ref 1.005–1.030)
Urobilinogen, Ur: 0.2 mg/dL (ref 0.2–1.0)
pH, UA: 8.5 — ABNORMAL HIGH (ref 5.0–7.5)

## 2019-12-18 LAB — MICROSCOPIC EXAMINATION: RBC, Urine: NONE SEEN /hpf (ref 0–2)

## 2019-12-18 LAB — BLADDER SCAN AMB NON-IMAGING: Scan Result: 4

## 2019-12-18 NOTE — Progress Notes (Signed)
12/18/2019 2:57 PM   Susan Maxwell 01-09-1966 [id number]  Referring provider: Rusty Aus, MD [facility] [place],  [place] [address]  No chief complaint on file.   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  She was under a lot of personal stress and had a flareup after many years that lasted 3 to 4 months.  She is have a lot of suprapubic pain with urgency and frequency but did not have a bladder infection.  She is on Lyrica by primary care and was taking Percocet but is now off Percocet.  She had a hydrodistention by Dr. Jacqlyn Larsen years ago.  Pain is gone.  She voids every 2-3 hours and gets up 4 times at night.  No ankle edema     PMH: Past Medical History:  Diagnosis Date  . Hypertension   . PONV (postoperative nausea and vomiting)     Surgical History: Past Surgical History:  Procedure Laterality Date  . BACK SURGERY  2016   L4-5  . COLONOSCOPY WITH PROPOFOL N/A 06/02/2018   Procedure: COLONOSCOPY WITH PROPOFOL;  Surgeon: Lucilla Lame, MD;  Location: [facility];  Service: Endoscopy;  Laterality: N/A;  Latex allergy  . LAPAROSCOPY  1998   endometrial procedure  . ORIF TIBIA & FIBULA FRACTURES Right 2014    Home Medications:  Allergies as of 12/18/2019      Reactions   Latex Swelling, Rash   After wearing gloves.      Medication List       Accurate as of December 18, 2019  2:57 PM. If you have any questions, ask your nurse or doctor.        amitriptyline 50 MG tablet Commonly known as: ELAVIL Take 50 mg by mouth at bedtime as needed for sleep.   aspirin 81 MG tablet Take 81 mg by mouth daily.   cloNIDine 0.2 MG tablet Commonly known as: CATAPRES Take 0.2 mg by mouth 3 (three) times daily.   gabapentin 600 MG tablet Commonly known as: NEURONTIN Take 600 mg by mouth 3 (three) times daily.   telmisartan 80 MG tablet Commonly known as: MICARDIS Take 80 mg by mouth daily.     Vitamin D3 50 MCG (2000 UT) Tabs Take by mouth daily.       Allergies:  Allergies  Allergen Reactions  . Latex Swelling and Rash    After wearing gloves.    Family History: Family History  Problem Relation Age of Onset  . Breast cancer Neg Hx     Social History:  reports that she quit smoking about 7 years ago. Her smoking use included cigarettes. She has never used smokeless tobacco. No history on file for alcohol and drug.  ROS:                                        Physical Exam: There were no vitals taken for this visit.  Constitutional:  Alert and oriented, No acute distress.   Laboratory Data: No results found for: WBC, HGB, HCT, MCV, PLT  Lab Results  Component Value Date   CREATININE 0.90 08/12/2016    No results found for: PSA  No results found for: TESTOSTERONE  No results found for: HGBA1C  Urinalysis No results found for: COLORURINE, APPEARANCEUR, LABSPEC, [place], [place], [place], [place], KETONESUR, PROTEINUR, UROBILINOGEN, NITRITE, LEUKOCYTESUR  Pertinent Imaging:   Assessment & Plan: Patient has interstitial cystitis with frequency and nocturia.  Urine sent for culture.  Agreed to see her as needed  1. Interstitial cystitis  - Urinalysis, Complete - BLADDER SCAN AMB NON-IMAGING   No follow-ups on file.  Martina Sinner, MD  [facility] [address] [place], [place] [address] [phone]

## 2019-12-21 LAB — CULTURE, URINE COMPREHENSIVE

## 2020-03-18 ENCOUNTER — Ambulatory Visit: Payer: 59 | Admitting: Urology

## 2020-03-25 ENCOUNTER — Encounter: Payer: Self-pay | Admitting: Urology

## 2020-03-25 ENCOUNTER — Ambulatory Visit: Payer: 59 | Admitting: Urology

## 2021-05-13 ENCOUNTER — Emergency Department
Admission: EM | Admit: 2021-05-13 | Discharge: 2021-05-13 | Disposition: A | Discharge status: Home / Self Care | Payer: Managed Care, Other (non HMO) | Attending: Emergency Medicine | Admitting: Emergency Medicine

## 2021-05-13 ENCOUNTER — Ambulatory Visit
Admission: EM | Admit: 2021-05-13 | Discharge: 2021-05-13 | Disposition: A | Discharge status: Home / Self Care | Payer: Managed Care, Other (non HMO) | Attending: Internal Medicine | Admitting: Internal Medicine

## 2021-05-13 ENCOUNTER — Other Ambulatory Visit: Payer: Self-pay

## 2021-05-13 ENCOUNTER — Encounter: Payer: Self-pay | Admitting: Emergency Medicine

## 2021-05-13 ENCOUNTER — Ambulatory Visit: Payer: Self-pay

## 2021-05-13 DIAGNOSIS — Z91040 Latex allergy status: Secondary | ICD-10-CM | POA: Diagnosis not present

## 2021-05-13 DIAGNOSIS — Z23 Encounter for immunization: Secondary | ICD-10-CM

## 2021-05-13 DIAGNOSIS — S81811A Laceration without foreign body, right lower leg, initial encounter: Secondary | ICD-10-CM

## 2021-05-13 DIAGNOSIS — Z87891 Personal history of nicotine dependence: Secondary | ICD-10-CM | POA: Insufficient documentation

## 2021-05-13 DIAGNOSIS — Z7982 Long term (current) use of aspirin: Secondary | ICD-10-CM | POA: Diagnosis not present

## 2021-05-13 DIAGNOSIS — I10 Essential (primary) hypertension: Secondary | ICD-10-CM | POA: Insufficient documentation

## 2021-05-13 DIAGNOSIS — Z79899 Other long term (current) drug therapy: Secondary | ICD-10-CM | POA: Diagnosis not present

## 2021-05-13 DIAGNOSIS — S81011A Laceration without foreign body, right knee, initial encounter: Secondary | ICD-10-CM | POA: Diagnosis not present

## 2021-05-13 DIAGNOSIS — W228XXA Striking against or struck by other objects, initial encounter: Secondary | ICD-10-CM | POA: Diagnosis not present

## 2021-05-13 DIAGNOSIS — S8991XA Unspecified injury of right lower leg, initial encounter: Secondary | ICD-10-CM | POA: Diagnosis present

## 2021-05-13 MED ORDER — BACITRACIN-NEOMYCIN-POLYMYXIN 400-5-5000 EX OINT
TOPICAL_OINTMENT | Freq: Once | CUTANEOUS | Status: AC
  Administered 2021-05-13: 1 via TOPICAL
  Filled 2021-05-13: qty 1

## 2021-05-13 MED ORDER — LIDOCAINE HCL (PF) 1 % IJ SOLN
5.0000 mL | Freq: Once | INTRAMUSCULAR | Status: AC
  Administered 2021-05-13: 5 mL via INTRADERMAL
  Filled 2021-05-13: qty 5

## 2021-05-13 MED ORDER — CEPHALEXIN 500 MG PO CAPS: 500.0000 mg | ORAL_CAPSULE | Freq: Three times a day (TID) | ORAL | 0 refills | Status: AC

## 2021-05-13 MED ORDER — TETANUS-DIPHTH-ACELL PERTUSSIS 5-2.5-18.5 LF-MCG/0.5 IM SUSY
0.5000 mL | PREFILLED_SYRINGE | Freq: Once | INTRAMUSCULAR | Status: AC
  Administered 2021-05-13: 0.5 mL via INTRAMUSCULAR

## 2021-05-13 NOTE — Discharge Instructions (Addendum)
Do not get the sutured area wet for 24 hours. After 24 hours, shower/bathe as usual and pat the area dry. °Change the bandage 2 times per day and apply antibiotic ointment. °Leave open to air when at no risk of getting the area dirty, but cover at night before bed. °See your PCP or go to Urgent Care in 10 days for suture removal or sooner for signs or concern of infection. ° °

## 2021-05-13 NOTE — ED Triage Notes (Signed)
Pt reports having a laceration behind R knee. Sts she fell on a wood fence.  -blood thinners

## 2021-05-13 NOTE — ED Triage Notes (Signed)
Pt reports fell on a wooden fence and cut the back of her right knee. Pt states she went to UC and was told it was too deep for them to stitch and to come to the ED.

## 2021-05-13 NOTE — ED Provider Notes (Signed)
Renaldo Fiddler    CSN: [id number] Arrival date & time: 05/13/21  1653      History   Chief Complaint Chief Complaint  Patient presents with   Laceration    HPI Susan Maxwell is a 55 y.o. female who presents with R posterior knee laceration which occurred when she fell on a wood fence today. She does not recall when her last TD was.     Past Medical History:  Diagnosis Date   Endometriosis    Hypertension    IC (interstitial cystitis)    PONV (postoperative nausea and vomiting)     Patient Active Problem List   Diagnosis Date Noted   Encounter for screening colonoscopy     Past Surgical History:  Procedure Laterality Date   BACK SURGERY  2016   L4-5   COLONOSCOPY WITH PROPOFOL N/A 06/02/2018   Procedure: COLONOSCOPY WITH PROPOFOL;  Surgeon: Midge Minium, MD;  Location: [facility];  Service: Endoscopy;  Laterality: N/A;  Latex allergy   LAPAROSCOPY  1998   endometrial procedure   ORIF TIBIA & FIBULA FRACTURES Right 2014    OB History   No obstetric history on file.      Home Medications    Prior to Admission medications   Medication Sig Start Date End Date Taking? Authorizing Provider  amitriptyline (ELAVIL) 50 MG tablet Take 50 mg by mouth at bedtime as needed for sleep.    [provider]  aspirin 81 MG tablet Take 81 mg by mouth daily.    [provider]  Cholecalciferol (VITAMIN D3) 2000 units TABS Take by mouth daily.    [provider]  cloNIDine (CATAPRES) 0.2 MG tablet Take 0.2 mg by mouth 3 (three) times daily.    [provider]  oxyCODONE-acetaminophen (PERCOCET/ROXICET) 5-325 MG tablet  11/22/19   [provider]  pregabalin (LYRICA) 150 MG capsule Take 150 mg by mouth 2 (two) times daily.    [provider]  telmisartan (MICARDIS) 80 MG tablet Take 80 mg by mouth daily.    [provider]  traMADol (ULTRAM) 50 MG tablet Take 50-100 mg by mouth 3 (three) times daily as  needed. 12/11/19   [provider]    Family History Family History  Problem Relation Age of Onset   Bladder Cancer Cousin    Breast cancer Neg Hx     Social History Social History   Tobacco Use   Smoking status: Former    Types: Cigarettes    Quit date: 2014    Years since quitting: 8.6   Smokeless tobacco: Never   Tobacco comments:    was social smoker   Advertising account planner   Vaping Use: Never used  Substance Use Topics   Alcohol use: Never   Drug use: Never     Allergies   Latex   Review of Systems Review of Systems  Musculoskeletal:  Positive for gait problem.  Skin:  Positive for wound. Negative for color change, pallor and rash.  Neurological:  Negative for numbness.    Physical Exam Triage Vital Signs ED Triage Vitals  Enc Vitals Group     BP 05/13/21 1705 109/66     Pulse Rate 05/13/21 1705 (!) 141     Resp 05/13/21 1705 18     Temp 05/13/21 1705 99.3 F (37.4 C)     Temp Source 05/13/21 1705 Oral     SpO2 05/13/21 1705 97 %     Weight 05/13/21  1703 225 lb (102.1 kg)     Height 05/13/21 1703 5\' 10"  (1.778 m)     Head Circumference --      Peak Flow --      Pain Score 05/13/21 1703 2     Pain Loc --      Pain Edu? --      Excl. in GC? --    No data found.  Updated Vital Signs BP 109/66 (BP Location: Left Arm)   Pulse (!) 141   Temp 99.3 F (37.4 C) (Oral)   Resp 18   Ht 5\' 10"  (1.778 m)   Wt 225 lb (102.1 kg)   SpO2 97%   BMI 32.28 kg/m   Visual Acuity Right Eye Distance:   Left Eye Distance:   Bilateral Distance:    Right Eye Near:   Left Eye Near:    Bilateral Near:     Physical Exam Vitals and nursing note reviewed.  Constitutional:      Appearance: She is obese.  HENT:     Right Ear: External ear normal.     Left Ear: External ear normal.  Eyes:     General: No scleral icterus.    Conjunctiva/sclera: Conjunctivae normal.  Pulmonary:     Effort: Pulmonary effort is normal.  Musculoskeletal:     Cervical back:  Neck supple.  Skin:    General: Skin is warm and dry.     Findings: No rash.     Comments: R POSTERIOR KNEE- on medial region has a very deep flap laceration with adipose coming out. The skin feels very tight and it will be hard to bring the skin together for closing.   Neurological:     Mental Status: She is alert and oriented to person, place, and time.     Gait: Gait normal.  Psychiatric:        Mood and Affect: Mood normal.        Behavior: Behavior normal.        Thought Content: Thought content normal.        Judgment: Judgment normal.     UC Treatments / Results  Labs (all labs ordered are listed, but only abnormal results are displayed) Labs Reviewed - No data to display  EKG   Radiology No results found.  Procedures Procedures (including critical care time)  Medications Ordered in UC Medications  Tdap (BOOSTRIX) injection 0.5 mL (0.5 mLs Intramuscular Given 05/13/21 1717)    Initial Impression / Assessment and Plan / UC Course  I have reviewed the triage vital signs and the nursing notes. TDAP given. She was sent to ER to have laceration repaired since it is so deep.       Final Clinical Impressions(s) / UC Diagnoses   Final diagnoses:  Laceration of leg, right, initial encounter     Discharge Instructions      Go to ER to have your wound closed, is very deep and tight and it will need to be undermined to bring the skin back together.      ED Prescriptions   None    PDMP not reviewed this encounter.   , PA-C 05/13/21 1726

## 2021-05-13 NOTE — ED Provider Notes (Signed)
[facility] Emergency Department Provider Note  ____________________________________________  Time seen: Approximately 6:46 PM  I have reviewed the triage vital signs and the nursing notes.   HISTORY  Chief Complaint Laceration   HPI Susan Maxwell is a 55 y.o. female presents to the emergency department for treatment and evaluation of laceration to the posterior aspect of the right knee.  She fell over a wooden fence causing a deep puncture/laceration.  Bleeding well controlled.  She initially presented to urgent care however they were unable to suture the wound but they did update her tetanus.  Past Medical History:  Diagnosis Date   Endometriosis    Hypertension    IC (interstitial cystitis)    PONV (postoperative nausea and vomiting)     Patient Active Problem List   Diagnosis Date Noted   Encounter for screening colonoscopy     Past Surgical History:  Procedure Laterality Date   BACK SURGERY  2016   L4-5   COLONOSCOPY WITH PROPOFOL N/A 06/02/2018   Procedure: COLONOSCOPY WITH PROPOFOL;  Surgeon: Midge Minium, MD;  Location: [facility];  Service: Endoscopy;  Laterality: N/A;  Latex allergy   LAPAROSCOPY  1998   endometrial procedure   ORIF TIBIA & FIBULA FRACTURES Right 2014    Prior to Admission medications   Medication Sig Start Date End Date Taking? Authorizing Provider  cephALEXin (KEFLEX) 500 MG capsule Take 1 capsule (500 mg total) by mouth 3 (three) times daily for 7 days. 05/13/21 05/20/21 Yes Kimmberly Wisser B, FNP  amitriptyline (ELAVIL) 50 MG tablet Take 50 mg by mouth at bedtime as needed for sleep.    [provider]  aspirin 81 MG tablet Take 81 mg by mouth daily.    [provider]  Cholecalciferol (VITAMIN D3) 2000 units TABS Take by mouth daily.    [provider]  cloNIDine (CATAPRES) 0.2 MG tablet Take 0.2 mg by mouth 3 (three) times daily.    [provider]  oxyCODONE-acetaminophen  (PERCOCET/ROXICET) 5-325 MG tablet  11/22/19   [provider]  pregabalin (LYRICA) 150 MG capsule Take 150 mg by mouth 2 (two) times daily.    [provider]  telmisartan (MICARDIS) 80 MG tablet Take 80 mg by mouth daily.    [provider]  traMADol (ULTRAM) 50 MG tablet Take 50-100 mg by mouth 3 (three) times daily as needed. 12/11/19   [provider]    Allergies Latex  Family History  Problem Relation Age of Onset   Bladder Cancer Cousin    Breast cancer Neg Hx     Social History Social History   Tobacco Use   Smoking status: Former    Types: Cigarettes    Quit date: 2014    Years since quitting: 8.6   Smokeless tobacco: Never   Tobacco comments:    was social smoker   Advertising account planner   Vaping Use: Never used  Substance Use Topics   Alcohol use: Never   Drug use: Never    Review of Systems  Constitutional: Negative for fever. Respiratory: Negative for cough or shortness of breath.  Musculoskeletal: Negative for myalgias Skin: Positive for laceration Neurological: Negative for numbness or paresthesias. ____________________________________________   PHYSICAL EXAM:  VITAL SIGNS: ED Triage Vitals  Enc Vitals Group     BP 05/13/21 1743 128/80     Pulse Rate 05/13/21 1743 (!) 120     Resp 05/13/21 1743 20     Temp 05/13/21 1743  99.6 F (37.6 C)     Temp Source 05/13/21 1743 Oral     SpO2 05/13/21 1743 98 %     Weight 05/13/21 1742 225 lb (102.1 kg)     Height 05/13/21 1742 5\' 10"  (1.778 m)     Head Circumference --      Peak Flow --      Pain Score 05/13/21 1742 3     Pain Loc --      Pain Edu? --      Excl. in GC? --      Constitutional: Well appearing. Eyes: Conjunctivae are clear without discharge or drainage. Nose: No rhinorrhea noted. Mouth/Throat: Airway is patent.  Neck: No stridor. Unrestricted range of motion observed. Cardiovascular: Capillary refill is <3 seconds.  Respiratory: Respirations are even and  unlabored.. Musculoskeletal: Unrestricted range of motion observed. Neurologic: Awake, alert, and oriented x 4.  Skin: 2.5 cm laceration to the popliteal aspect of the right knee  ____________________________________________   LABS (all labs ordered are listed, but only abnormal results are displayed)  Labs Reviewed - No data to display ____________________________________________  EKG  Not indicated. ____________________________________________  RADIOLOGY  Not indicated ____________________________________________   PROCEDURES  .08/18/22Laceration Repair  Date/Time: 05/13/2021 7:32 PM Performed by: 05/15/2021, FNP Authorized by: Chinita Pester, FNP   Consent:    Consent obtained:  Verbal   Consent given by:  Patient   Risks discussed:  Infection and poor cosmetic result Universal protocol:    Patient identity confirmed:  Verbally with patient Anesthesia:    Anesthesia method:  Local infiltration   Local anesthetic:  Lidocaine 1% w/o epi Laceration details:    Location:  Leg   Leg location:  R knee   Length (cm):  2.5 Pre-procedure details:    Preparation:  Patient was prepped and draped in usual sterile fashion Exploration:    Hemostasis achieved with:  Direct pressure   Wound exploration: wound explored through full range of motion and entire depth of wound visualized     Contaminated: no   Treatment:    Area cleansed with:  Povidone-iodine and saline   Amount of cleaning:  Standard   Irrigation solution:  Sterile saline   Irrigation volume:  100   Irrigation method:  Syringe   Layers/structures repaired:  Deep subcutaneous Deep subcutaneous:    Suture size:  4-0   Suture material:  Monocryl   Suture technique:  Running Skin repair:    Repair method:  Sutures   Suture size:  4-0   Suture material:  Nylon   Suture technique:  Simple interrupted Approximation:    Approximation:  Close Repair type:    Repair type:  Simple Post-procedure details:     Dressing:  Antibiotic ointment, sterile dressing and adhesive bandage   Procedure completion:  Tolerated ____________________________________________   INITIAL IMPRESSION / ASSESSMENT AND PLAN / ED COURSE  Susan Maxwell is a 55 y.o. female presenting to the emergency department for treatment and evaluation of laceration to the right lower extremity.  See HPI for further details.  Wound was repaired as above.  She was also placed in a knee immobilizer to hopefully prevent disruption of the sutures.  Wound care was discussed.  She is to have the sutures evaluated for removal in 10 days.  She will be placed on Keflex empirically.  She was advised to see primary care or return to the emergency department for concerns.  Medications  lidocaine (PF) (XYLOCAINE) 1 %  injection 5 mL (5 mLs Intradermal Given by Other 05/13/21 1851)  neomycin-bacitracin-polymyxin (NEOSPORIN) ointment packet (1 application Topical Given by Other 05/13/21 1851)     Pertinent labs & imaging results that were available during my care of the patient were reviewed by me and considered in my medical decision making (see chart for details).  ____________________________________________   FINAL CLINICAL IMPRESSION(S) / ED DIAGNOSES  Final diagnoses:  Laceration of skin of right lower leg, initial encounter    ED Discharge Orders          Ordered    cephALEXin (KEFLEX) 500 MG capsule  3 times daily        05/13/21 1922             Note:  This document was prepared using Dragon voice recognition software and may include unintentional dictation errors.   Chinita Pester, FNP 05/13/21 2357    Jene Every, MD 05/16/21 [phone]

## 2021-05-13 NOTE — Discharge Instructions (Addendum)
Go to ER to have your wound closed, is very deep and tight and it will need to be undermined to bring the skin back together.

## 2022-11-18 DIAGNOSIS — F1190 Opioid use, unspecified, uncomplicated: Secondary | ICD-10-CM | POA: Diagnosis not present

## 2022-11-18 DIAGNOSIS — I10 Essential (primary) hypertension: Secondary | ICD-10-CM | POA: Diagnosis not present

## 2022-11-18 DIAGNOSIS — M25532 Pain in left wrist: Secondary | ICD-10-CM | POA: Diagnosis not present

## 2022-11-18 DIAGNOSIS — N3010 Interstitial cystitis (chronic) without hematuria: Secondary | ICD-10-CM | POA: Diagnosis not present

## 2022-11-25 DIAGNOSIS — M25432 Effusion, left wrist: Secondary | ICD-10-CM | POA: Diagnosis not present

## 2022-11-25 DIAGNOSIS — M654 Radial styloid tenosynovitis [de Quervain]: Secondary | ICD-10-CM | POA: Diagnosis not present
